# Patient Record
Sex: Female | Born: 1975 | Hispanic: Yes | Smoking: Never smoker
Health system: Southern US, Community
[De-identification: ages and names within clinical notes are randomized; demographics above are authoritative.]

---

## 2016-02-25 ENCOUNTER — Ambulatory Visit (INDEPENDENT_AMBULATORY_CARE_PROVIDER_SITE_OTHER): Payer: Self-pay | Admitting: Obstetrics & Gynecology

## 2016-02-25 ENCOUNTER — Encounter: Payer: Self-pay | Admitting: Obstetrics & Gynecology

## 2016-02-25 VITALS — BP 140/87 | HR 84 | Wt 205.0 lb

## 2016-02-25 DIAGNOSIS — B9689 Other specified bacterial agents as the cause of diseases classified elsewhere: Secondary | ICD-10-CM

## 2016-02-25 DIAGNOSIS — N946 Dysmenorrhea, unspecified: Secondary | ICD-10-CM

## 2016-02-25 DIAGNOSIS — Z01419 Encounter for gynecological examination (general) (routine) without abnormal findings: Secondary | ICD-10-CM

## 2016-02-25 DIAGNOSIS — N852 Hypertrophy of uterus: Secondary | ICD-10-CM

## 2016-02-25 DIAGNOSIS — Z30011 Encounter for initial prescription of contraceptive pills: Secondary | ICD-10-CM

## 2016-02-25 DIAGNOSIS — Z3009 Encounter for other general counseling and advice on contraception: Secondary | ICD-10-CM

## 2016-02-25 DIAGNOSIS — R102 Pelvic and perineal pain: Secondary | ICD-10-CM

## 2016-02-25 DIAGNOSIS — G8929 Other chronic pain: Secondary | ICD-10-CM

## 2016-02-25 DIAGNOSIS — N760 Acute vaginitis: Secondary | ICD-10-CM

## 2016-02-25 MED ORDER — IBUPROFEN 800 MG PO TABS: 800.0000 mg | ORAL_TABLET | Freq: Three times a day (TID) | ORAL | 2 refills | Status: AC | PRN

## 2016-02-25 MED ORDER — LEVONORGEST-ETH ESTRAD 91-DAY 0.15-0.03 MG PO TABS: 1.0000 | ORAL_TABLET | Freq: Every day | ORAL | 4 refills | Status: AC

## 2016-02-25 NOTE — Progress Notes (Signed)
GYNECOLOGY ANNUAL PREVENTATIVE CARE ENCOUNTER NOTE  Subjective:   Sarah Bray is a 40 y.o. G22P2002 female here for a routine annual gynecologic exam and to discuss contraception. Patient is Spanish-speaking only, Spanish interpreter present for this encounter.  Current complaints: dysmenorrhea since birth of her child in 2003, worsening in last two years.  Pain is only associated with period, but she feels "pressure in lower abdomen" occasionally.  Denies abnormal vaginal bleeding, discharge, problems with intercourse or other gynecologic concerns.    Gynecologic History Patient's last menstrual period was 02/09/2016 (exact date). Contraception: none Last Pap: Over 5 years ago. Results were: normal  Obstetric History OB History  Gravida Para Term Preterm AB Living  2 2 2     2   SAB TAB Ectopic Multiple Live Births          2    # Outcome Date GA Lbr Len/2nd Weight Sex Delivery Anes PTL Lv  2 Term 04/24/01     CS-LTranv   LIV  1 Term 05/11/00     CS-LTranv   LIV      No past medical history on file.  History reviewed. No pertinent surgical history.  No current outpatient prescriptions on file prior to visit.   No current facility-administered medications on file prior to visit.     No Known Allergies  Social History   Social History  . Marital status: Unknown    Spouse name: N/A  . Number of children: N/A  . Years of education: N/A   Occupational History  . Not on file.   Social History Main Topics  . Smoking status: Never Smoker  . Smokeless tobacco: Never Used  . Alcohol use No  . Drug use: No  . Sexual activity: Yes   Other Topics Concern  . Not on file   Social History Narrative  . No narrative on file   No family history on file.  The following portions of the patient's history were reviewed and updated as appropriate: allergies, current medications, past family history, past medical history, past social history, past surgical history and  problem list.  Review of Systems Pertinent items noted in HPI and remainder of comprehensive ROS otherwise negative.   Objective:  BP 140/87   Pulse 84   Wt 205 lb (93 kg)   LMP 02/09/2016 (Exact Date)  CONSTITUTIONAL: Well-developed, well-nourished female in no acute distress.  HENT:  Normocephalic, atraumatic, External right and left ear normal. Oropharynx is clear and moist EYES: Conjunctivae and EOM are normal. Pupils are equal, round, and reactive to light. No scleral icterus.  NECK: Normal range of motion, supple, no masses.  Normal thyroid.  SKIN: Skin is warm and dry. No rash noted. Not diaphoretic. No erythema. No pallor. NEUROLOGIC: Alert and oriented to person, place, and time. Normal reflexes, muscle tone coordination. No cranial nerve deficit noted. PSYCHIATRIC: Normal mood and affect. Normal behavior. Normal judgment and thought content. CARDIOVASCULAR: Normal heart rate noted, regular rhythm RESPIRATORY: Clear to auscultation bilaterally. Effort and breath sounds normal, no problems with respiration noted. BREASTS: Large, symmetric in size. No masses, skin changes, nipple drainage, or lymphadenopathy. ABDOMEN: Soft, obese, normal bowel sounds, no distention noted.  No tenderness, rebound or guarding.  PELVIC: Normal appearing external genitalia; normal appearing vaginal mucosa and cervix.  No abnormal discharge noted.  Pap smear obtained.  Enlarged uterus palpated, about 18-20 week size, no other palpable masses, no uterine or adnexal tenderness. MUSCULOSKELETAL: Normal range of motion. No tenderness.  No cyanosis, clubbing, or edema.  2+ distal pulses.   Assessment and Plan:  1. Encounter for routine gynecological examination with Papanicolaou smear of cervix - Cytology - PAP done today, will follow up results and manage accordingly.  2. General counseling and advice on female contraception 3. Oral contraceptive prescribed Patient desired OCPs after extensive counseling  about contraceptive modailities. Talked about menstrual suppression to help with dysmenorrhea also, prescribed extended hormonal regimen OCPs. Will follow up response - levonorgestrel-ethinyl estradiol (SEASONALE,INTROVALE,JOLESSA) 0.15-0.03 MG tablet; Take 1 tablet by mouth daily.  Dispense: 1 Package; Refill: 4  4. Dysmenorrhea 5. Chronic female pelvic pain 6. Enlarged uterus NSAIDs prescribed.  OCPs hopefully should help. Will obtain ultrasound, she likely has fibroids.  Will follow up results and manage accordingly. - ibuprofen (ADVIL,MOTRIN) 800 MG tablet; Take 1 tablet (800 mg total) by mouth 3 (three) times daily with meals as needed.  Dispense: 30 tablet; Refill: 2 - levonorgestrel-ethinyl estradiol (SEASONALE,INTROVALE,JOLESSA) 0.15-0.03 MG tablet; Take 1 tablet by mouth daily.  Dispense: 1 Package; Refill: 4 - US Pelvis Complete; Future - US Transvaginal Non-OB; Future  Routine preventative health maintenance measures emphasized. Please refer to After Visit Summary for other counseling recommendations.   Return in about 2 months (around 04/27/2016), or if symptoms worsen or fail to improve, for OCP/BP check.   Verita Schneiders, MD, [place] Attending Obstetrician & Gynecologist, [place] for [facility]

## 2016-02-25 NOTE — Patient Instructions (Signed)
Dismenorrea (Dysmenorrhea) Los Stage manager (dismenorrea) se originan en los espasmos musculares del tero (contracciones) durante el perodo menstrual. En algunas mujeres, el malestar es slo [place]. En otras, la dismenorrea puede ser tan intensa que interfiere con las actividades diarias durante algunos das, [employer]. La dismenorrea primaria son los clicos menstruales que duran algunos das al inicio de los perodos o poco despus. En general se inician despus de que la adolescente comienza a tener sus perodos. A medida que la mujer avanza en edad, o tiene un beb, los clicos generalmente disminuyen o desaparecen. La dismenorrea secundaria comienza en pocas posteriores de la vida, dura ms tiempo, y Conservation officer, historic buildings puede ser ms intenso. El dolor puede comenzar antes del perodo y [place] despus. CAUSAS La causa de la dismenorrea generalmente es un problema subyacente, por ejemplo:  El tejido que recubre interiormente al tero crece fuera del tero en otras reas del cuerpo (endometriosis).  The endometrial tissue, which normally lines the uterus, is found in or grows into the muscular walls of the uterus (adenomyosis).  Los vasos sanguneos de la pelvis se congestionan con sangre antes del perodo menstrual (sndrome congestivo plvico).  Crecimiento excesivo de las clulas (plipos) del revestimiento interior del tero o del cuello uterino.  Cada del tero (prolapso) debido a distensin o flojedad de los ligamentos.  Depresin.  Problemas como infeccin o inflamacin en la vejiga.  Problemas en el intestino, un tumor, o sndrome de colon irritable.  Cncer de los rganos femeninos o en la vejiga.  Un tero muy inclinado.  Una apertura muy [place] uterino cerrado.  Tumores no cancerosos en el tero (fibromas).  Enfermedad plvica inflamatoria (EPI)  Cicatrices en la pelvis (adherencias) por cirugas previas.  Quiste  ovrico  El uso del dispositivo intrauterino (DIU) como mtodo anticonceptivo. FACTORES DE RIESGO Puede tener ms riesgo de dismenorrea si:  Tiene menos de 30 aos.  La pubertad se inici temprano.  Tiene sangrado irregular o abundante.  No ha tenido hijos.  Tiene una historia familiar de este problema.  Es fumadora. SIGNOS Y SNTOMAS  Clicos y dolor punzante en la zona inferior del abdomen.  Dolores de [place].  Dolor de Doctor, hospital.  Nuseas o vmitos.  Diarrea.  Sudoracin o [employer].  Heces blandas. DIAGNSTICO El diagnstico se basa en la historia personal, los sntomas, el examen fsico, las pruebas diagnsticas o procedimientos. Las pruebas diagnsticas o procedimientos pueden ser:  [place] de [place].  Ecografas.  Examen de las capas que cubren el tero (dilatacin y curetaje, D&C).  Examen de la zona interna del abdomen o la pelvis con un laparoscopio.  Radiografas.  Tomografa computada.  Resonancia magntica.  Un examen del interior de la vejiga con un citoscopio.  Examen del interior del intestino o el estmago con un dispositivo especial (colonoscopio o un gastroscopio). TRATAMIENTO El tratamiento depende de la causa de la dismenorrea. El tratamiento puede incluir:  Analgsicos indicados por su mdico.  Pldoras anticonceptivas o un DIU con progesterona.  Terapia de reemplazo hormonal.  Antiinflamatorios no esteroides (AINE). Los antiinflamatorios pueden detener la produccin de prostaglandinas.  Ciruga para extirpar las adherencias, endometriosis, quiste de ovarios o fibromas.  Extirpacin del tero (histerectoma).  Inyecciones de progesterona para detener el perodo menstrual.  Corte de los nervios del sacro que van hacia los rganos femeninos (neurectoma presacra).  Corriente elctrica en los nervios sacros (estimulacin de los nervios sacros).  Medicamentos antidepresivos.  Terapia psiquitrica, psicoterapia individual o de  grupo.  Actividad fsica y fisioterapia.  Meditacin y yoga.  Acupuntura. INSTRUCCIONES PARA EL CUIDADO EN EL HOGAR  Tome slo medicamentos de venta libre o recetados, segn las indicaciones del mdico.  Coloque una almohadilla elctrica o una botella con agua caliente en la zona inferior del abdomen. No duerma con la almohadilla trmica.  Haga ejercicios aerbicos como caminar, nadar, andar en bicicleta, para aliviar los clicos.  Masajear la zona inferior de la espalda o el abdomen puede ser de ayuda.  Deje de fumar.  Evite la cafena y el alcohol. SOLICITE ATENCIN MDICA SI:  El dolor no mejora con los medicamentos recetados.  Tiene dolor durante las relaciones sexuales.  El dolor aumenta y no puede controlarlo con los medicamentos.  Observa una hemorragia vaginal anormal con el perodo.  Tiene nuseas o vmitos con el perodo menstrual que no puede controlar con medicamentos. SOLICITE ATENCIN MDICA DE INMEDIATO SI: Se desmaya. Esta informacin no tiene como fin reemplazar el consejo del mdico. Asegrese de hacerle al mdico cualquier pregunta que tenga. Document Released: 12/15/2004 Document Revised: 11/07/2012 Document Reviewed: 08/23/2012 Elsevier Interactive Patient Education  2017 Elsevier Inc.  

## 2016-02-29 LAB — CYTOLOGY - PAP
Chlamydia: NEGATIVE
DIAGNOSIS: NEGATIVE
HPV: NOT DETECTED
Neisseria Gonorrhea: NEGATIVE
TRICH (WINDOWPATH): NEGATIVE

## 2016-02-29 LAB — CERVICOVAGINAL ANCILLARY ONLY: CANDIDA VAGINITIS: NEGATIVE

## 2016-02-29 MED ORDER — METRONIDAZOLE 500 MG PO TABS: 500.0000 mg | ORAL_TABLET | Freq: Two times a day (BID) | ORAL | 0 refills | Status: AC

## 2016-02-29 NOTE — Addendum Note (Signed)
Addended by: Verita Schneiders A on: 02/29/2016 02:20 PM   Modules accepted: Orders

## 2016-03-01 ENCOUNTER — Ambulatory Visit (HOSPITAL_COMMUNITY)
Admission: RE | Admit: 2016-03-01 | Discharge: 2016-03-01 | Disposition: A | Discharge status: Home / Self Care | Payer: Self-pay | Source: Ambulatory Visit | Attending: Obstetrics & Gynecology | Admitting: Obstetrics & Gynecology

## 2016-03-01 DIAGNOSIS — N852 Hypertrophy of uterus: Secondary | ICD-10-CM

## 2016-03-01 DIAGNOSIS — D259 Leiomyoma of uterus, unspecified: Secondary | ICD-10-CM | POA: Insufficient documentation

## 2016-03-01 DIAGNOSIS — R102 Pelvic and perineal pain: Secondary | ICD-10-CM

## 2016-03-01 DIAGNOSIS — N946 Dysmenorrhea, unspecified: Secondary | ICD-10-CM

## 2016-03-01 DIAGNOSIS — G8929 Other chronic pain: Secondary | ICD-10-CM | POA: Insufficient documentation

## 2016-03-07 ENCOUNTER — Telehealth: Payer: Self-pay | Admitting: General Practice

## 2016-03-07 NOTE — Telephone Encounter (Signed)
Per Dr Harolyn Rutherford, patient's ultrasound shows fibroids as discussed at her visit. Called patient with pacific interpreter [phone], no answer- left message on voicemail to call back for non urgent results

## 2016-03-16 NOTE — Telephone Encounter (Signed)
Called pt w/Pacific interpreter Cory Roughen # [phone] and informed her of [place] results. She was reminded of follow up appt on 04/28/16 @ 1520 and voiced understanding of all information given.

## 2016-04-28 ENCOUNTER — Ambulatory Visit: Payer: Self-pay | Admitting: Obstetrics & Gynecology

## 2017-09-18 IMAGING — US US PELVIS COMPLETE
1 series · 15 of 25 positions shown · non-contrast
Comparison: No prior.

CLINICAL DATA: Chronic pain.

EXAM:
TRANSABDOMINAL AND TRANSVAGINAL ULTRASOUND OF PELVIS
TECHNIQUE: Both transabdominal and transvaginal ultrasound examinations of the
pelvis were performed. Transabdominal technique was performed for
global imaging of the pelvis including uterus, ovaries, adnexal
regions, and pelvic cul-de-sac. It was necessary to proceed with
endovaginal exam following the transabdominal exam to visualize the
uterus and ovaries.

[Series 1: us pelvis complete · 15 of 68 slices shown]
[im 1/68]
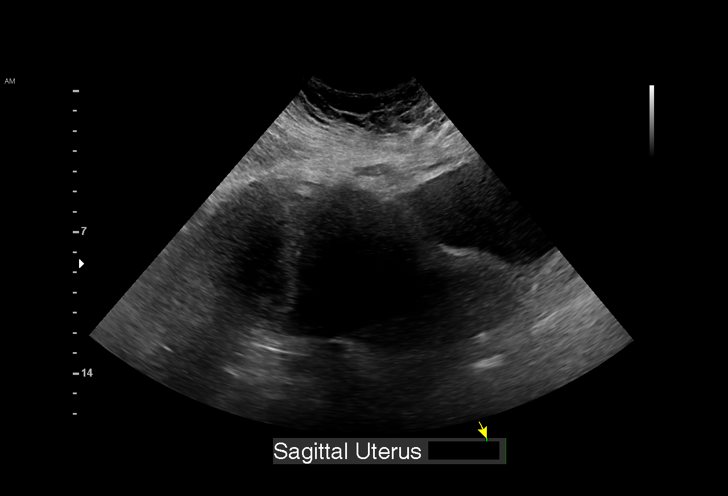
[im 6/68]
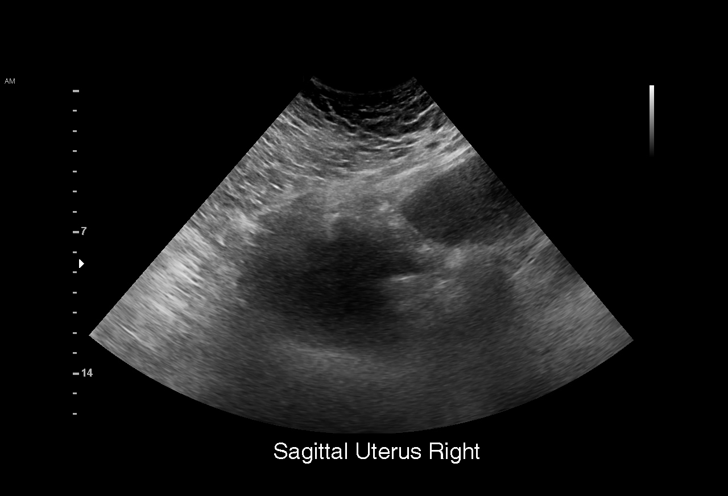
[im 12/68]
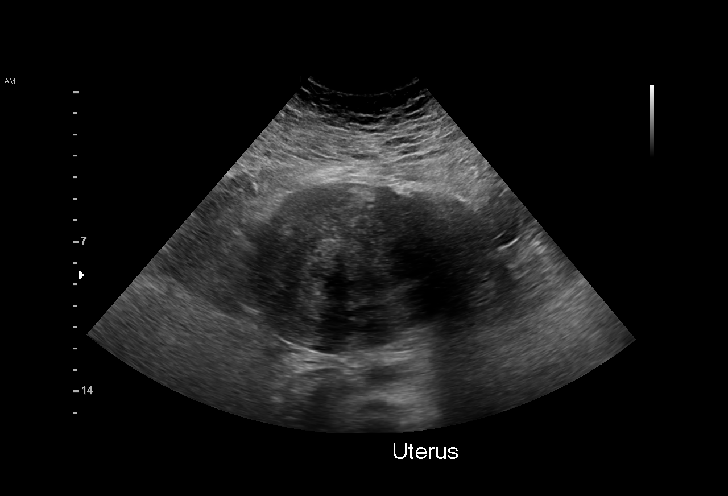
[im 14/68]
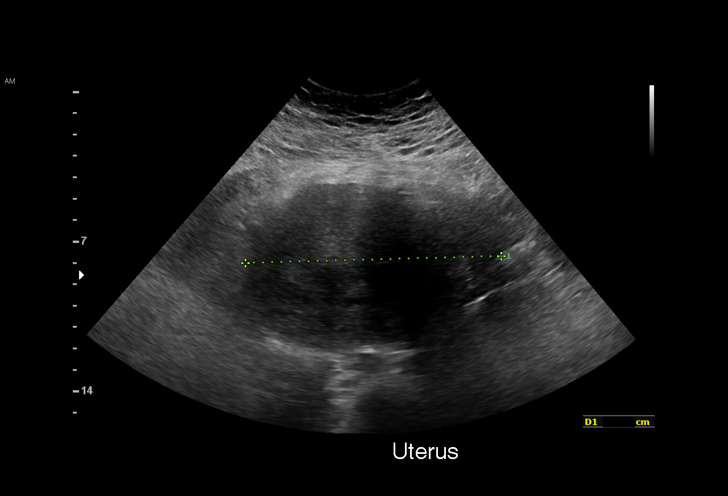
[im 20/68]
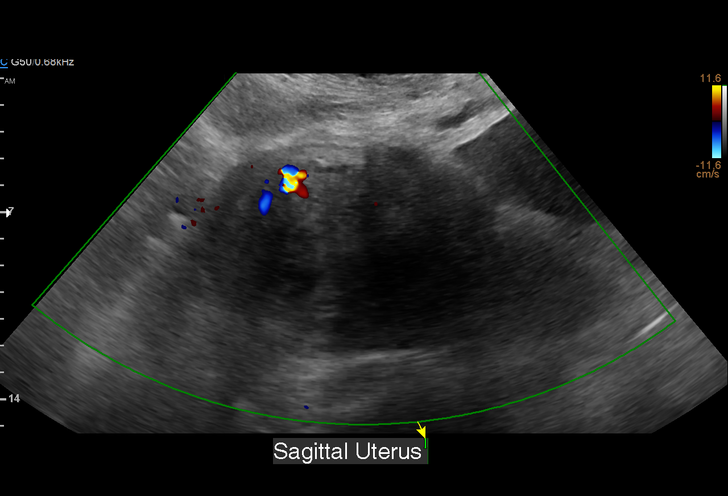
[im 26/68]
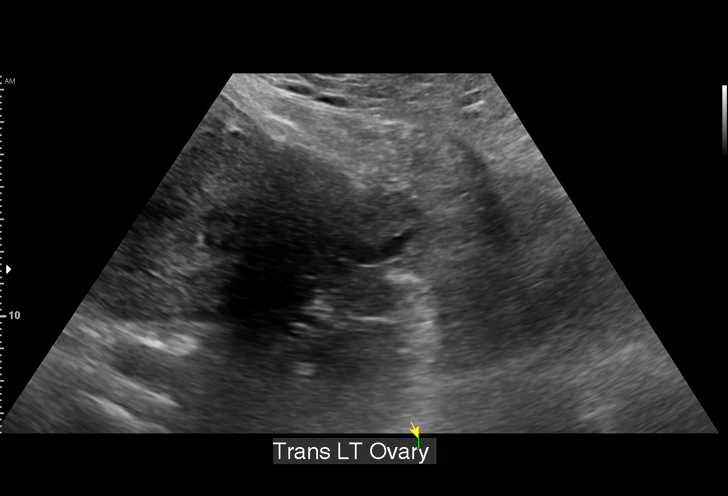
[im 28/68]
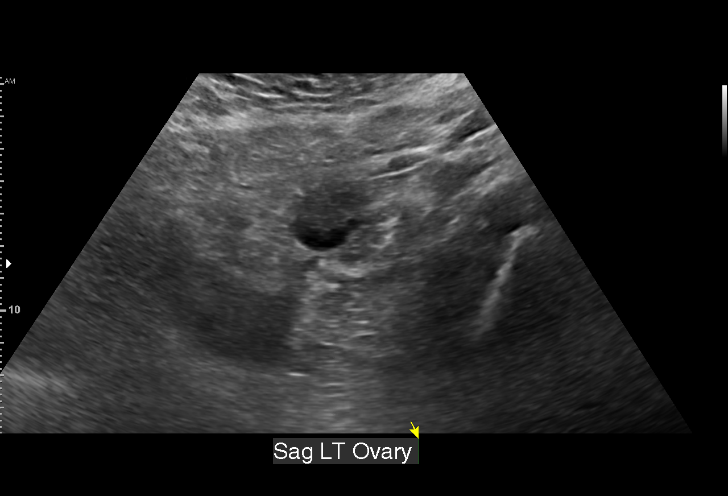
[im 34/68]
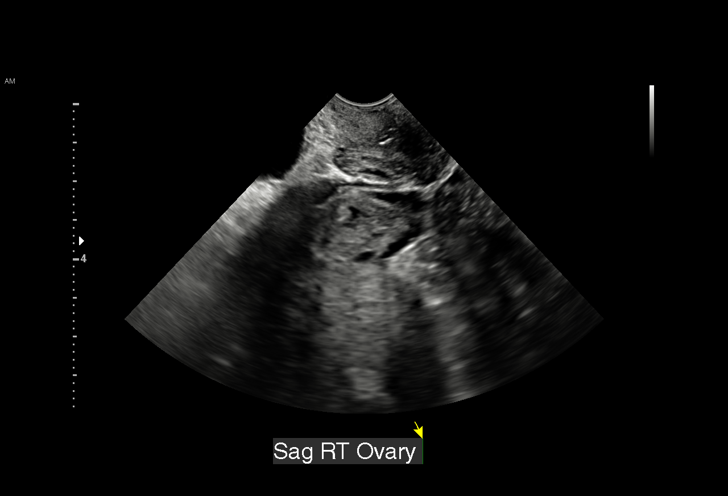
[im 40/68]
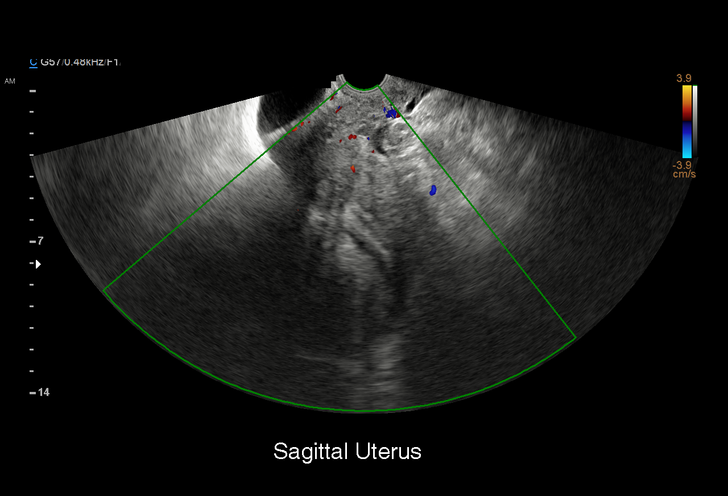
[im 42/68]
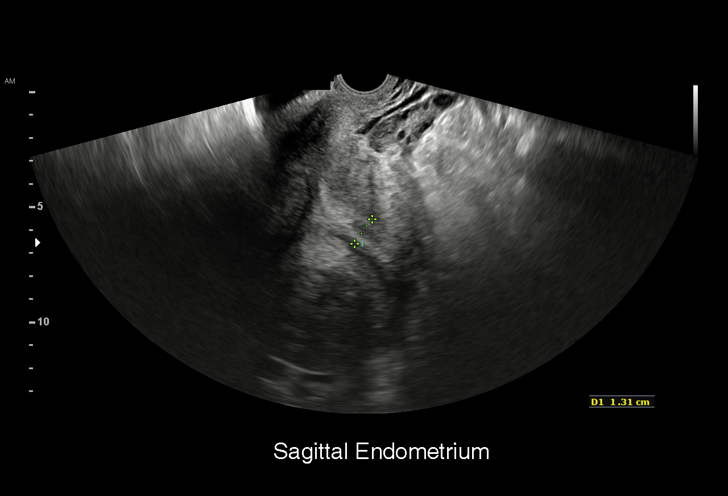
[im 48/68]
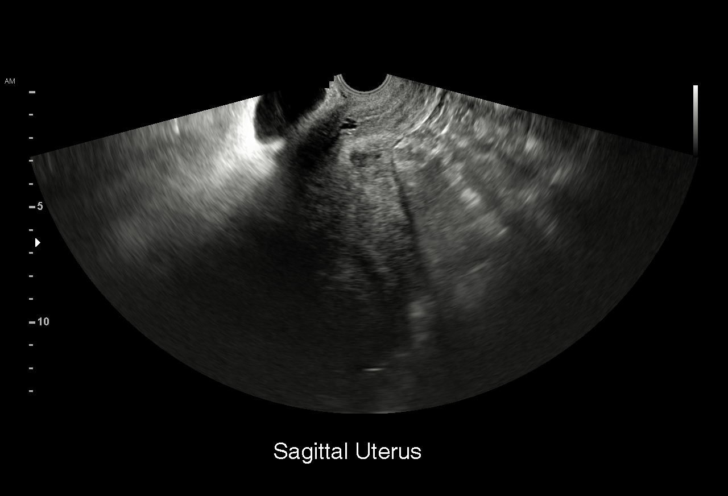
[im 54/68]
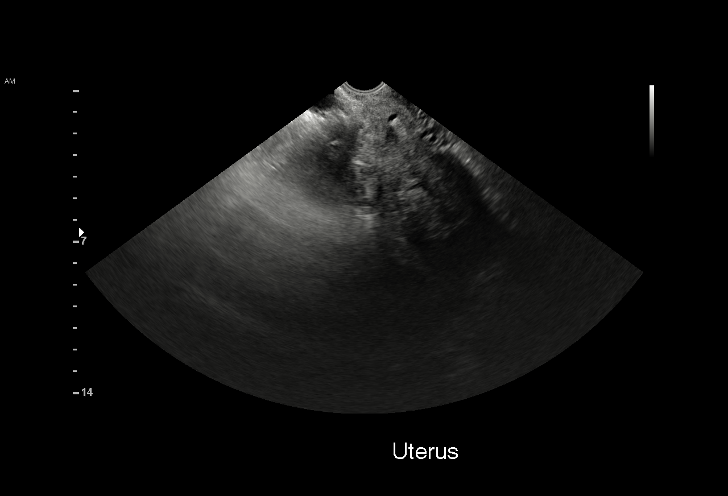
[im 56/68]
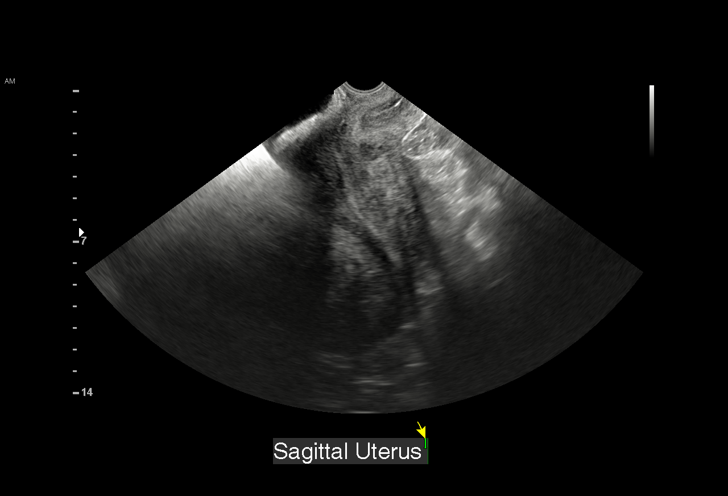
[im 62/68]
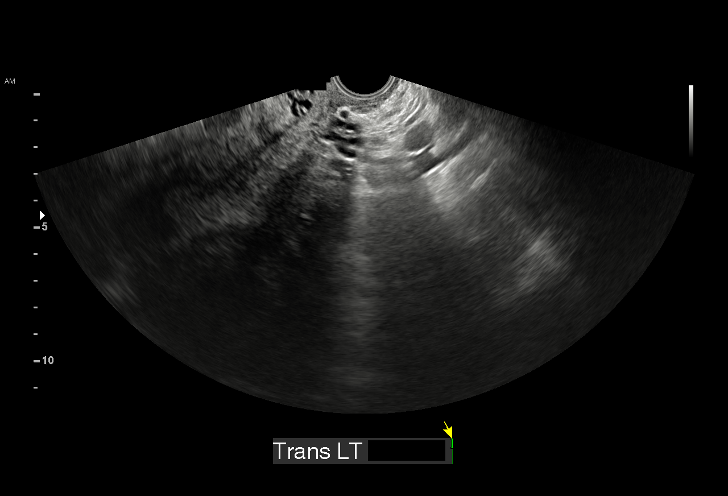
[im 68/68]
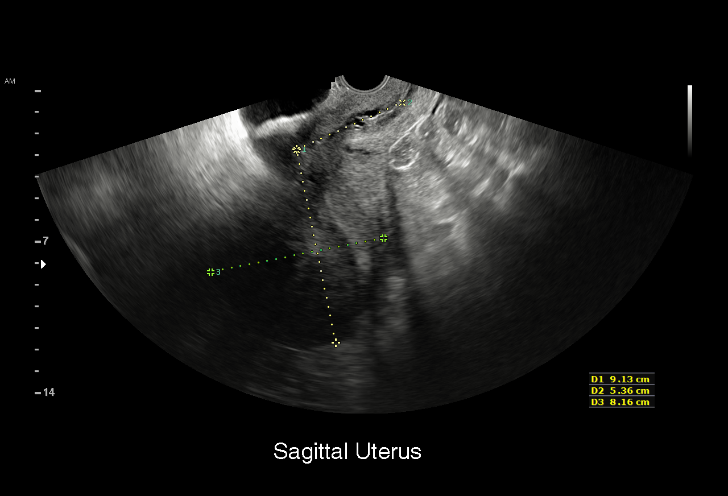

[15 of 25 positions shown; findings below may reference images not displayed]

FINDINGS: Uterus

Measurements: 17.0 x 8.2 x 11.9 cm. 87.0 x 6.1 x 7.4 cm fibroid
noted in the fundus. 5.3 x 2.9 x 4.8 cm fibroid noted in the
anterior right body.

Endometrium

Thickness: 13.1 mm.  No focal abnormality visualized.

Right ovary

Measurements: 3.5 x 2.3 x 2.9 cm. Normal appearance/no adnexal mass.

Left ovary

Measurements: 2.5 x 2.2 x 3.2 cm. Normal appearance/no adnexal mass.

Other findings

No abnormal free fluid.
IMPRESSION: Fibroid uterus with the largest fibroid in the fundus measuring
cm in maximum diameter.
